# Patient Record
Sex: Female | Born: 1935 | Race: White | Hispanic: No | Smoking: Never smoker
Health system: Southern US, Community
[De-identification: ages and names within clinical notes are randomized; demographics above are authoritative.]

## PROBLEM LIST (undated history)

## (undated) DIAGNOSIS — I639 Cerebral infarction, unspecified: Secondary | ICD-10-CM

## (undated) DIAGNOSIS — I10 Essential (primary) hypertension: Secondary | ICD-10-CM

## (undated) HISTORY — PX: REVISION TOTAL HIP ARTHROPLASTY: SHX766

## (undated) HISTORY — PX: BACK SURGERY: SHX140

---

## 2016-03-05 ENCOUNTER — Emergency Department (HOSPITAL_BASED_OUTPATIENT_CLINIC_OR_DEPARTMENT_OTHER): Payer: Medicare Other

## 2016-03-05 ENCOUNTER — Emergency Department (HOSPITAL_BASED_OUTPATIENT_CLINIC_OR_DEPARTMENT_OTHER)
Admission: EM | Admit: 2016-03-05 | Discharge: 2016-03-05 | Disposition: A | Discharge status: Home / Self Care | Payer: Medicare Other | Attending: Emergency Medicine | Admitting: Emergency Medicine

## 2016-03-05 ENCOUNTER — Encounter (HOSPITAL_BASED_OUTPATIENT_CLINIC_OR_DEPARTMENT_OTHER): Payer: Self-pay

## 2016-03-05 DIAGNOSIS — I10 Essential (primary) hypertension: Secondary | ICD-10-CM | POA: Diagnosis not present

## 2016-03-05 DIAGNOSIS — W1809XA Striking against other object with subsequent fall, initial encounter: Secondary | ICD-10-CM | POA: Diagnosis not present

## 2016-03-05 DIAGNOSIS — Z79899 Other long term (current) drug therapy: Secondary | ICD-10-CM | POA: Insufficient documentation

## 2016-03-05 DIAGNOSIS — S0083XA Contusion of other part of head, initial encounter: Secondary | ICD-10-CM | POA: Insufficient documentation

## 2016-03-05 DIAGNOSIS — M549 Dorsalgia, unspecified: Secondary | ICD-10-CM | POA: Insufficient documentation

## 2016-03-05 DIAGNOSIS — Y999 Unspecified external cause status: Secondary | ICD-10-CM | POA: Insufficient documentation

## 2016-03-05 DIAGNOSIS — S0093XA Contusion of unspecified part of head, initial encounter: Secondary | ICD-10-CM

## 2016-03-05 DIAGNOSIS — S0990XA Unspecified injury of head, initial encounter: Secondary | ICD-10-CM | POA: Diagnosis present

## 2016-03-05 DIAGNOSIS — Z791 Long term (current) use of non-steroidal anti-inflammatories (NSAID): Secondary | ICD-10-CM | POA: Insufficient documentation

## 2016-03-05 DIAGNOSIS — Y929 Unspecified place or not applicable: Secondary | ICD-10-CM | POA: Diagnosis not present

## 2016-03-05 DIAGNOSIS — Y9389 Activity, other specified: Secondary | ICD-10-CM | POA: Diagnosis not present

## 2016-03-05 DIAGNOSIS — W19XXXA Unspecified fall, initial encounter: Secondary | ICD-10-CM

## 2016-03-05 HISTORY — DX: Essential (primary) hypertension: I10

## 2016-03-05 HISTORY — DX: Cerebral infarction, unspecified: I63.9

## 2016-03-05 NOTE — Discharge Instructions (Signed)
Head CT without any acute injuries to the brain or the skull. Wound care for the scalp abrasion with soap and water. Follow-up with your doctor as needed.

## 2016-03-05 NOTE — ED Triage Notes (Addendum)
Slipped fell approx 7pm- pain to left side of head- abrased area to left parietal-no bleeding at this time-no LOC with fall-NAD-HHN concerned due to pt takes plavix

## 2016-03-05 NOTE — ED Provider Notes (Signed)
[facility] Note   CSN: [id number] Arrival date & time: 03/05/16  1949  By signing my name below, I, Majel Homer, attest that this documentation has been prepared under the direction and in the presence of Vanetta Mulders, MD . Electronically Signed: Majel Homer, Scribe. 03/05/2016. 9:14 PM.  History   Chief Complaint Chief Complaint  Patient presents with  . Fall   The history is provided by the patient. No language interpreter was used.   HPI Comments: Melinda Oliver is a 82 y.o. female with PMHx of HTN and stroke, who presents to the Emergency Department complaining of sudden onset, abrasion to the left parietal region of her head s/p a fall that occurred at ~7:00 PM this evening. Pt reports she was opening the door for her cat when she lost her balance and fell backwards striking her head on the doorknob. She denies loss of consciousness, headache and any other injuries. She notes she is currently taking Plavix.   Past Medical History:  Diagnosis Date  . Hypertension   . Stroke [facility])    There are no active problems to display for this patient.  Past Surgical History:  Procedure Laterality Date  . BACK SURGERY    . REVISION TOTAL HIP ARTHROPLASTY      OB History    No data available     Home Medications    Prior to Admission medications   Medication Sig Start Date End Date Taking? Authorizing Provider  acetaminophen (TYLENOL) 500 MG tablet Take 500 mg by mouth every 6 (six) hours as needed.   Yes Historical Provider, MD  calcium carbonate (OS-CAL - DOSED IN MG OF ELEMENTAL CALCIUM) 1250 (500 Ca) MG tablet Take 1 tablet by mouth.   Yes Historical Provider, MD  clopidogrel (PLAVIX) 75 MG tablet Take 75 mg by mouth daily.   Yes Historical Provider, MD  ferrous sulfate 325 (65 FE) MG tablet Take 325 mg by mouth daily with breakfast.   Yes Historical Provider, MD  lisinopril (PRINIVIL,ZESTRIL) 40 MG tablet Take 40 mg by mouth daily.   Yes Historical  Provider, MD  ondansetron (ZOFRAN) 4 MG tablet Take 4 mg by mouth every 8 (eight) hours as needed for nausea or vomiting.   Yes Historical Provider, MD  polyethylene glycol (MIRALAX / GLYCOLAX) packet Take 17 g by mouth daily.   Yes Historical Provider, MD  traMADol (ULTRAM) 50 MG tablet Take by mouth every 6 (six) hours as needed.   Yes Historical Provider, MD    Family History No family history on file.  Social History Social History  Substance Use Topics  . Smoking status: Never Smoker  . Smokeless tobacco: Never Used  . Alcohol use No     Allergies   Amlodipine; Fosamax [alendronate sodium]; Statins; Sulfa antibiotics; and Tricor [fenofibrate]   Review of Systems Review of Systems  Constitutional: Negative for chills and fever.  HENT: Negative for rhinorrhea and sore throat.   Eyes: Negative for visual disturbance.  Respiratory: Negative for cough and shortness of breath.   Cardiovascular: Negative for chest pain and leg swelling.  Gastrointestinal: Negative for abdominal pain, diarrhea, nausea and vomiting.  Genitourinary: Negative for dysuria.  Musculoskeletal: Positive for back pain. Negative for neck pain.  Skin: Negative for rash.  Neurological: Negative for syncope and headaches.  Hematological: Bruises/bleeds easily.   Physical Exam Updated Vital Signs BP 157/78 (BP Location: Right Arm)   Pulse 97   Temp 98.4 F (36.9 C) (Oral)  Resp 18   Ht 5\' 5"  (1.651 m)   Wt 110 lb (49.9 kg)   SpO2 99%   BMI 18.30 kg/m   Physical Exam  Constitutional: She is oriented to person, place, and time.  HENT:  Head: Normocephalic.  Mouth/Throat: Oropharynx is clear and moist.  Left parietal area has hematoma with bruising that measures 3 cm with a 1 cm abrasion  Moist mucous membranes   Eyes: EOM are normal. Pupils are equal, round, and reactive to light. No scleral icterus.  Eyes are tracking normally  Neck:  No posterior midline neck tenderness; pt is moving her  neck around normally  Cardiovascular: Normal rate, regular rhythm and normal heart sounds.   No murmur heard. Pulmonary/Chest:  Lungs clear to auscultation bilaterally   Abdominal: Bowel sounds are normal. There is no tenderness.  Musculoskeletal: She exhibits no edema, tenderness or deformity.  Left forearm: 2 areas of ecchymoses on left forearm measuring 3 cm and mid forearm measuring 5 cm, respectively  No swelling in ankles   Neurological: She is alert and oriented to person, place, and time. No cranial nerve deficit. She exhibits normal muscle tone. Coordination normal.  Skin: No rash noted. No erythema. No pallor.   ED Treatments / Results  Labs (all labs ordered are listed, but only abnormal results are displayed) Labs Reviewed - No data to display  EKG  EKG Interpretation None       Radiology Ct Head Wo Contrast  Result Date: 03/05/2016 CLINICAL DATA:  Larey SeatFell at 19:00 today. EXAM: CT HEAD WITHOUT CONTRAST TECHNIQUE: Contiguous axial images were obtained from the base of the skull through the vertex without intravenous contrast. COMPARISON:  None. FINDINGS: Brain: There is no intracranial hemorrhage, mass or evidence of acute infarction. There is moderate generalized atrophy. There is moderate chronic microvascular ischemic change. There is no significant extra-axial fluid collection. No acute intracranial findings are evident. Vascular: No hyperdense vessel or unexpected calcification. Skull: Normal. Negative for fracture or focal lesion. Sinuses/Orbits: Complete opacification of the right maxillary sinus, indeterminate chronicity. No evidence of an acute fracture on the included portions facial bones. IMPRESSION: 1. Moderate generalized atrophy.  No acute intracranial findings. 2. Opacified right maxillary sinus of indeterminate chronicity Electronically Signed   By: Ellery Plunkaniel R Mitchell M.D.   On: 03/05/2016 21:57    Procedures Procedures (including critical care  time)  Medications Ordered in ED Medications - No data to display  Initial Impression / Assessment and Plan / ED Course  I have reviewed the triage vital signs and the nursing notes.  Pertinent labs & imaging results that were available during my care of the patient were reviewed by me and considered in my medical decision making (see chart for details).  Clinical Course  DIAGNOSTIC STUDIES:  Oxygen Saturation is 99% on RA, normal by my interpretation.    COORDINATION OF CARE:  9:10 PM Discussed treatment plan with pt at bedside and pt agreed to plan.  Patient status post fall at home no loss of consciousness. Abrasion to left parietal area. Some contusion to the left forearm. No bony abnormality tear head CT negative for any acute findings. Patient stable for discharge home.  I personally performed the services described in this documentation, which was scribed in my presence. The recorded information has been reviewed and is accurate.   Final Clinical Impressions(s) / ED Diagnoses   Final diagnoses:  Fall, initial encounter  Head contusion, initial encounter    New Prescriptions New Prescriptions  No medications on file     Vanetta Mulders, MD 03/05/16 2207

## 2017-03-28 IMAGING — CT CT HEAD W/O CM
3 series · 14 of 47 positions shown, 16 images · non-contrast
Comparison: None.

CLINICAL DATA: Fell at [DATE] today.

EXAM:
CT HEAD WITHOUT CONTRAST
TECHNIQUE: Contiguous axial images were obtained from the base of the skull
through the vertex without intravenous contrast.

[Series 2: head wo · axial · 0.45mm/px · z∈[+1220,+1346]mm · 8 of 30 slices shown, 10 images]
[im 3/30  brain]
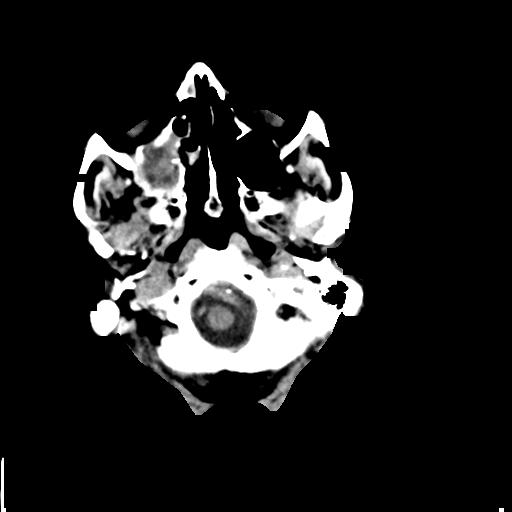
[im 3/30  bone]
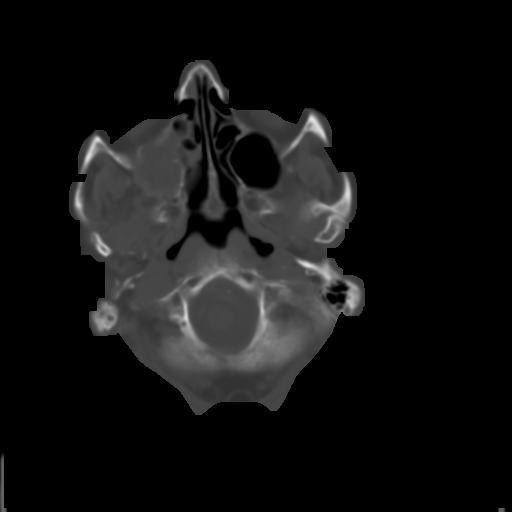
[im 7/30  brain]
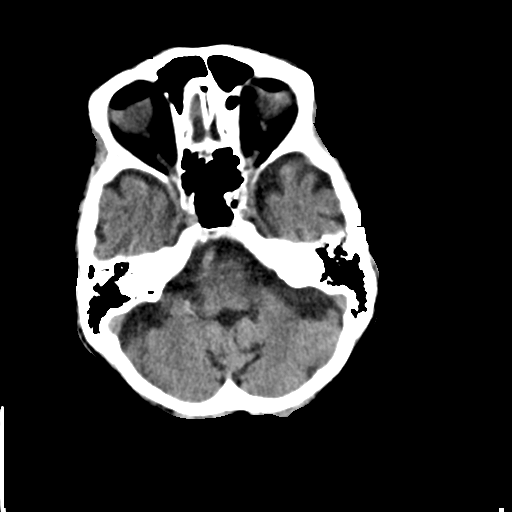
[im 10/30  brain]
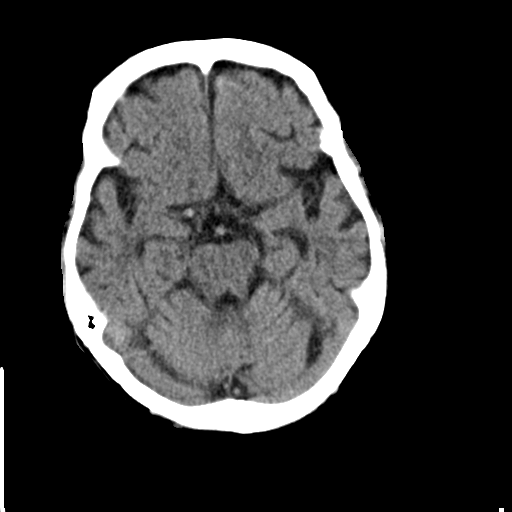
[im 14/30  brain]
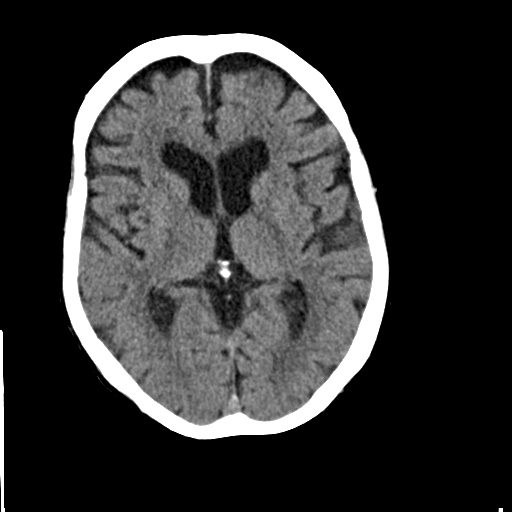
[im 17/30  brain]
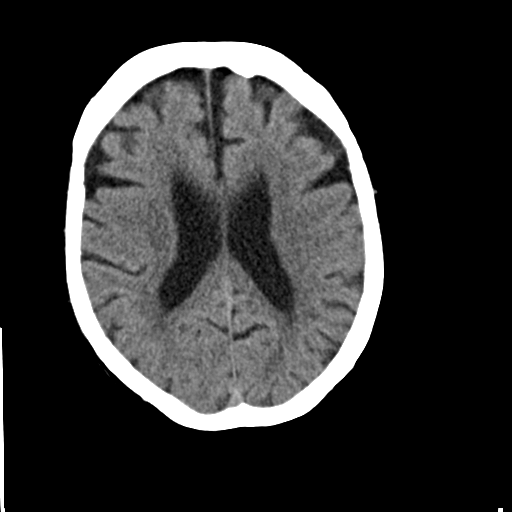
[im 17/30  bone]
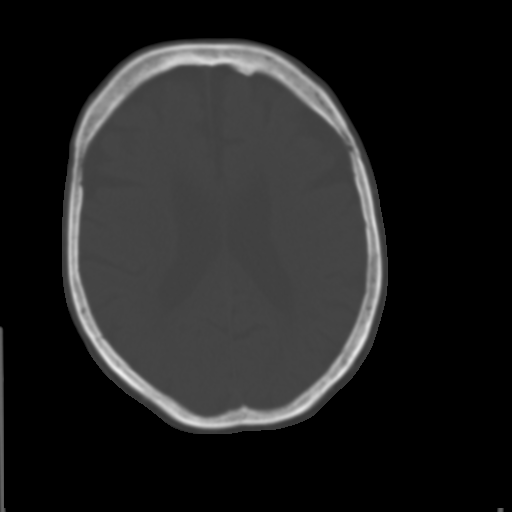
[im 21/30  brain]
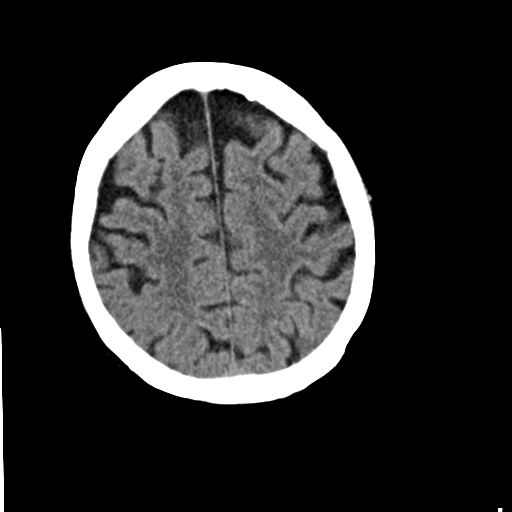
[im 24/30  brain]
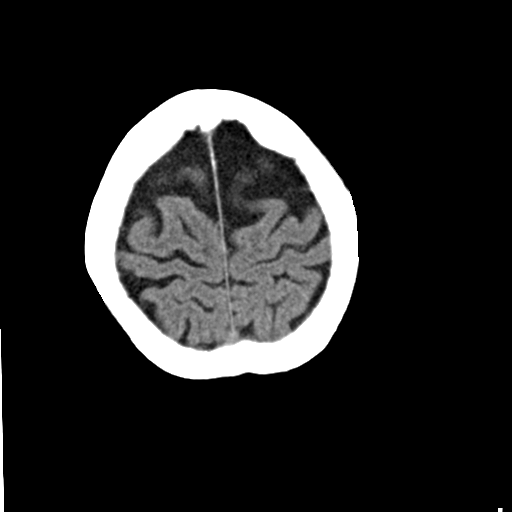
[im 28/30  brain]
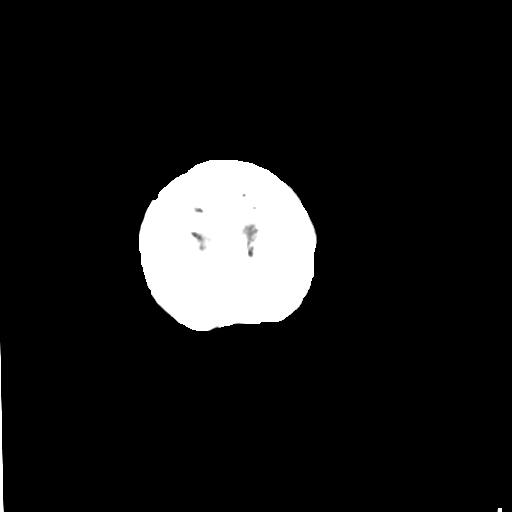

[Series 4: coronal soft · coronal · 0.29mm/px · 3 of 67 slices shown]
[im 23/67  brain]
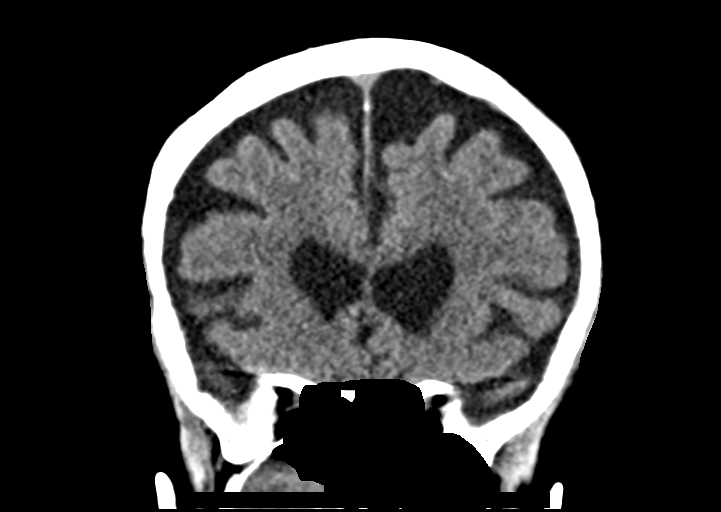
[im 30/67  brain]
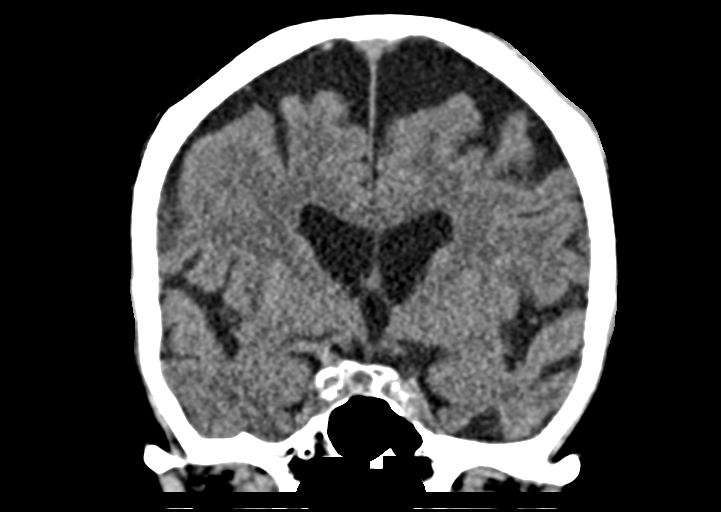
[im 37/67  brain]
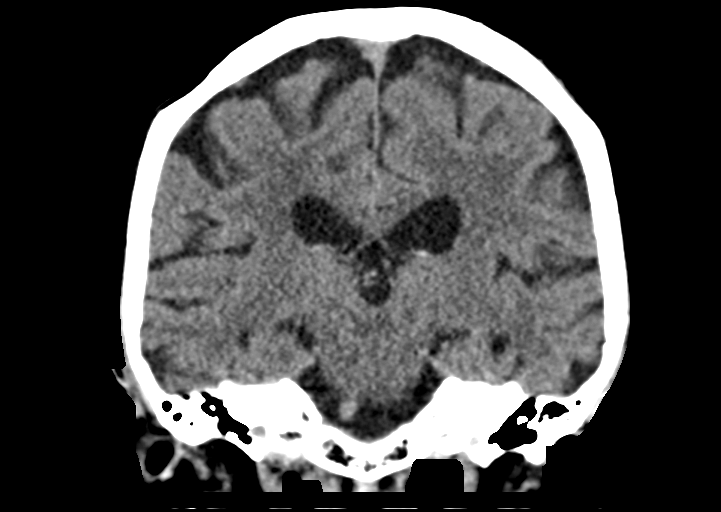

[Series 5: sag soft · sagittal · 0.29mm/px · 3 of 67 slices shown]
[im 23/67  brain]
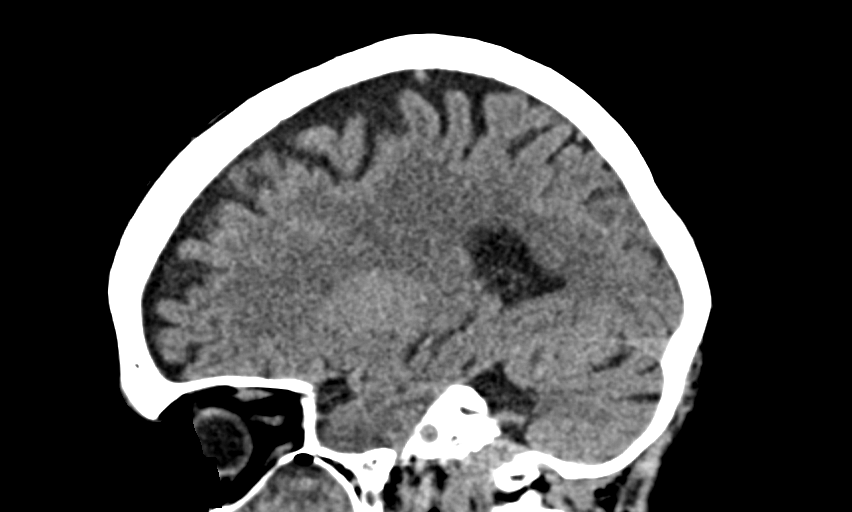
[im 34/67  brain]
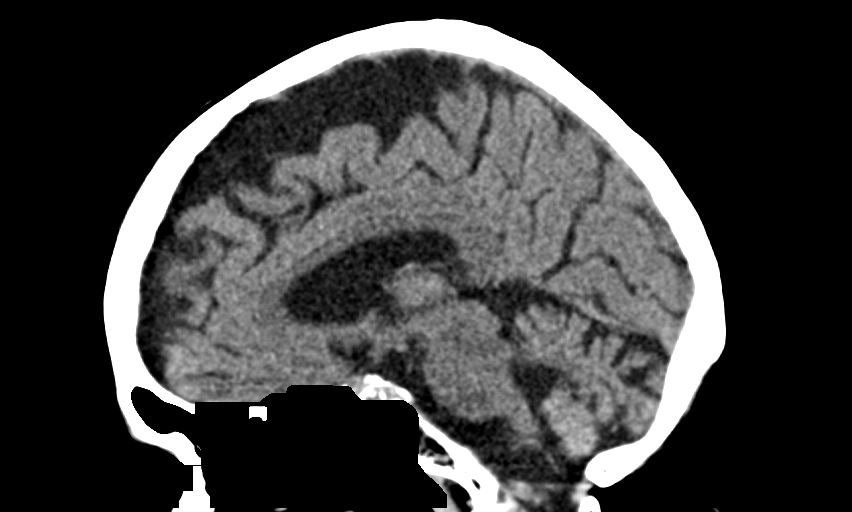
[im 45/67  brain]
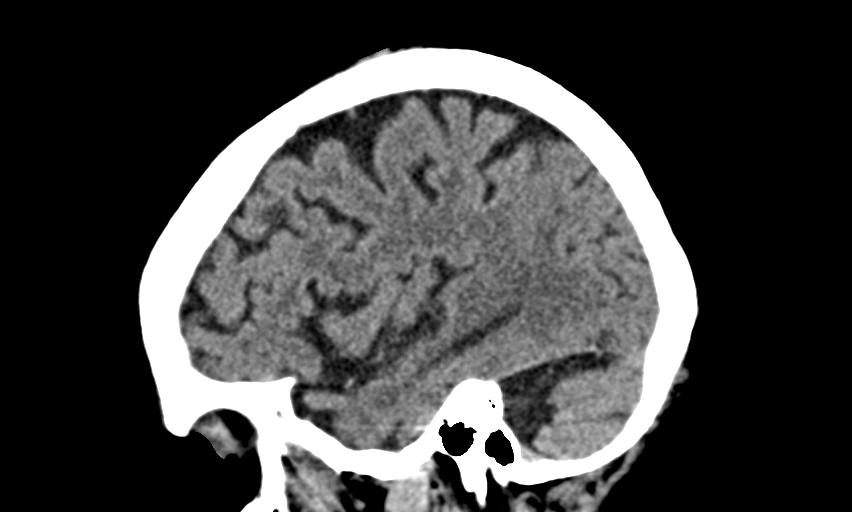

[14 of 47 positions shown; findings below may reference images not displayed]

FINDINGS: Brain:

There is no intracranial hemorrhage, mass or evidence of acute
infarction. There is moderate generalized atrophy. There is moderate
chronic microvascular ischemic change. There is no significant
extra-axial fluid collection.

No acute intracranial findings are evident.

Vascular: No hyperdense vessel or unexpected calcification.

Skull: Normal. Negative for fracture or focal lesion.

Sinuses/Orbits: Complete opacification of the right maxillary sinus,
indeterminate chronicity. No evidence of an acute fracture on the
included portions facial bones.
IMPRESSION: 1. Moderate generalized atrophy.  No acute intracranial findings.
2. Opacified right maxillary sinus of indeterminate chronicity

## 2022-07-23 DEATH — deceased
